# Patient Record
Sex: Male | Born: 1990
Health system: Southern US, Community
[De-identification: ages and names within clinical notes are randomized; demographics above are authoritative.]

---
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
## 2016-07-13 ENCOUNTER — Telehealth: Payer: Self-pay | Admitting: Physician Assistant

## 2016-07-14 NOTE — Telephone Encounter (Signed)
Patient uses [facility] in [place]. Patient states that he will need to call back to make appointment to see when he can get off to come in to be seen.  Advised patient that he would need to be seen. Patient verbalizes understanding.

## 2016-07-14 NOTE — Telephone Encounter (Signed)
Noted, will prescribe when I see him.

## 2016-07-14 NOTE — Telephone Encounter (Signed)
I need the pharmacy to send the request for documentation sake.  And he needs an appointment before I will do any extended script.  I have not seen him in the past 90 days I know for sure.

## 2016-07-14 NOTE — Telephone Encounter (Signed)
lmtcb

## 2016-07-19 ENCOUNTER — Ambulatory Visit: Payer: Self-pay | Admitting: Physician Assistant

## 2017-09-14 ENCOUNTER — Ambulatory Visit (INDEPENDENT_AMBULATORY_CARE_PROVIDER_SITE_OTHER): Admitting: Physician Assistant

## 2017-09-14 ENCOUNTER — Encounter: Payer: Self-pay | Admitting: Physician Assistant

## 2017-09-14 VITALS — BP 134/82 | HR 80 | Temp 99.0°F | Ht 74.0 in | Wt 198.0 lb

## 2017-09-14 DIAGNOSIS — M109 Gout, unspecified: Secondary | ICD-10-CM

## 2017-09-14 MED ORDER — ALLOPURINOL 100 MG PO TABS: 100.0000 mg | ORAL_TABLET | Freq: Three times a day (TID) | ORAL | 8 refills | Status: DC

## 2017-09-14 NOTE — Patient Instructions (Signed)

## 2017-09-14 NOTE — Progress Notes (Signed)
BP 134/82   Pulse 80   Temp 99 F (37.2 C) (Oral)   Ht 6\' 2"  (1.88 m)   Wt 198 lb (89.8 kg)   BMI 25.42 kg/m    Subjective:    Patient ID: Rick Stevens, male    DOB: January 24, 1991, 26 y.o.   MRN: [id number]  HPI: Rick MeyerKyle W Bath is a 26 y.o. male presenting on 09/14/2017 for Follow-up (Patient went to urgent care 10/28 and they told him he had gout in right ankle )  This patient comes in to be established.  He was a patient of mine years ago.  He does not usually get to the doctor very much.  About 10 days ago the patient woke up having swelling and significant pain on the right lateral foot.  He stated that was hot to the touch.  It hurt to even walk on it.  It hurt for anything to touch it.  He denies any fever or chills.  He denies ever having gout before.  He is not aware of any family history of gout.  Relevant past medical, surgical, family and social history reviewed and updated as indicated. Allergies and medications reviewed and updated.  History reviewed. No pertinent past medical history.  History reviewed. No pertinent surgical history.  Review of Systems  Constitutional: Negative.  Negative for appetite change and fatigue.  Eyes: Negative for pain and visual disturbance.  Respiratory: Negative.  Negative for cough, chest tightness, shortness of breath and wheezing.   Cardiovascular: Negative.  Negative for chest pain, palpitations and leg swelling.  Gastrointestinal: Negative.  Negative for abdominal pain, diarrhea, nausea and vomiting.  Genitourinary: Negative.   Musculoskeletal: Positive for arthralgias, gait problem and joint swelling.  Skin: Negative.  Negative for color change and rash.  Neurological: Negative for weakness, numbness and headaches.  Psychiatric/Behavioral: Negative.     Allergies as of 09/14/2017   No Known Allergies     Medication List        Accurate as of 09/14/17  3:44 PM. Always use your most recent med list.          allopurinol 100 MG  tablet Commonly known as:  ZYLOPRIM Take 1 tablet (100 mg total) 3 (three) times daily by mouth.   indomethacin 50 MG capsule Commonly known as:  INDOCIN Take 50 mg by mouth.          Objective:    BP 134/82   Pulse 80   Temp 99 F (37.2 C) (Oral)   Ht 6\' 2"  (1.88 m)   Wt 198 lb (89.8 kg)   BMI 25.42 kg/m   No Known Allergies  Physical Exam  Constitutional: He appears well-developed and well-nourished. No distress.  HENT:  Head: Normocephalic and atraumatic.  Eyes: Conjunctivae and EOM are normal. Pupils are equal, round, and reactive to light.  Cardiovascular: Normal rate, regular rhythm and normal heart sounds.  Pulmonary/Chest: Effort normal and breath sounds normal. No respiratory distress.  Musculoskeletal:       Right ankle: Tenderness. Lateral malleolus tenderness found.       Feet:  Skin: Skin is warm and dry. No rash noted. No erythema.  Psychiatric: He has a normal mood and affect. His behavior is normal.  Nursing note and vitals reviewed.   No results found for this or any previous visit.    Assessment & Plan:   1. Acute gout of right ankle, unspecified cause - indomethacin (INDOCIN) 50 MG capsule; Take 50 mg  by mouth. - allopurinol (ZYLOPRIM) 100 MG tablet; Take 1 tablet (100 mg total) 3 (three) times daily by mouth.  Dispense: 30 tablet; Refill: 8    Current Outpatient Medications:  .  allopurinol (ZYLOPRIM) 100 MG tablet, Take 1 tablet (100 mg total) 3 (three) times daily by mouth., Disp: 30 tablet, Rfl: 8 .  indomethacin (INDOCIN) 50 MG capsule, Take 50 mg by mouth., Disp: , Rfl:  Continue all other maintenance medications as listed above.  Follow up plan: Follow-up as needed or worsening of symptoms. Call office for any issues.   Educational handout given for gout prevention  Remus LofflerAngel S. Jeryn Cerney PA-C [facility] [address]  [place], [place] [address] [phone]   09/14/2017, 3:44 PM

## 2020-12-22 DIAGNOSIS — R599 Enlarged lymph nodes, unspecified: Secondary | ICD-10-CM | POA: Diagnosis not present

## 2020-12-22 DIAGNOSIS — J0100 Acute maxillary sinusitis, unspecified: Secondary | ICD-10-CM | POA: Diagnosis not present

## 2020-12-22 DIAGNOSIS — Z6825 Body mass index (BMI) 25.0-25.9, adult: Secondary | ICD-10-CM | POA: Diagnosis not present

## 2020-12-22 DIAGNOSIS — F419 Anxiety disorder, unspecified: Secondary | ICD-10-CM | POA: Diagnosis not present

## 2021-02-09 DIAGNOSIS — J351 Hypertrophy of tonsils: Secondary | ICD-10-CM | POA: Diagnosis not present

## 2021-02-09 DIAGNOSIS — J342 Deviated nasal septum: Secondary | ICD-10-CM | POA: Diagnosis not present

## 2021-04-21 ENCOUNTER — Ambulatory Visit (INDEPENDENT_AMBULATORY_CARE_PROVIDER_SITE_OTHER): Payer: BC Managed Care – PPO | Admitting: Nurse Practitioner

## 2021-04-21 ENCOUNTER — Encounter: Payer: Self-pay | Admitting: Nurse Practitioner

## 2021-04-21 ENCOUNTER — Other Ambulatory Visit: Payer: Self-pay

## 2021-04-21 ENCOUNTER — Ambulatory Visit (INDEPENDENT_AMBULATORY_CARE_PROVIDER_SITE_OTHER): Payer: BC Managed Care – PPO

## 2021-04-21 VITALS — BP 147/103 | HR 113 | Ht 74.0 in | Wt 204.0 lb

## 2021-04-21 DIAGNOSIS — R1011 Right upper quadrant pain: Secondary | ICD-10-CM | POA: Diagnosis not present

## 2021-04-21 DIAGNOSIS — R109 Unspecified abdominal pain: Secondary | ICD-10-CM | POA: Diagnosis not present

## 2021-04-21 NOTE — Assessment & Plan Note (Signed)
New right upper quadrant pain in the last 7 days.  No nausea, fever, chills.  Patient reports pain is mild and intermittent.  Completed abdominal x-ray to rule out gallbladder abnormalities- r we will esults pending.  Encourage patient to use Tylenol/ibuprofen for pain.  Education provided to patient with printed handouts.  Increase hydration.  And follow-up with worsening unresolved symptoms.

## 2021-04-21 NOTE — Progress Notes (Signed)
Acute Office Visit  Subjective:    Patient ID: Rick Stevens, male    DOB: [birth year], 30 y.o.   MRN: [id number]  Chief Complaint  Patient presents with   Abdominal Pain    RUQ. Comes and goes.     Abdominal Pain This is a new problem. The current episode started in the past 7 days. The onset quality is gradual. The problem occurs intermittently. The problem has been unchanged. The pain is located in the RUQ. The pain is at a severity of 3/10. The pain is mild. The quality of the pain is aching. The abdominal pain does not radiate. Pertinent negatives include no constipation, fever, flatus, headaches, nausea or vomiting. Nothing aggravates the pain. He has tried nothing for the symptoms.     Social History   Socioeconomic History   Marital status: Single    Spouse name: Not on file   Number of children: Not on file   Years of education: Not on file   Highest education level: Not on file  Occupational History   Not on file  Tobacco Use   Smoking status: Every Day    Pack years: 0.00   Smokeless tobacco: Never  Vaping Use   Vaping Use: Never used  Substance and Sexual Activity   Alcohol use: Yes    Comment: occ   Drug use: No   Sexual activity: Not on file  Other Topics Concern   Not on file  Social History Narrative   Not on file   Social Determinants of Health   Financial Resource Strain: Not on file  Food Insecurity: Not on file  Transportation Needs: Not on file  Physical Activity: Not on file  Stress: Not on file  Social Connections: Not on file  Intimate Partner Violence: Not on file    Outpatient Medications Prior to Visit  Medication Sig Dispense Refill   allopurinol (ZYLOPRIM) 100 MG tablet Take 1 tablet (100 mg total) 3 (three) times daily by mouth. 30 tablet 8   No facility-administered medications prior to visit.    No Known Allergies  Review of Systems  Constitutional: Negative.  Negative for fever.  Cardiovascular: Negative.    Gastrointestinal:  Positive for abdominal pain. Negative for constipation, flatus, nausea and vomiting.  Genitourinary: Negative.   Skin:  Negative for rash.  Neurological:  Negative for headaches.  All other systems reviewed and are negative.     Objective:    Physical Exam Vitals and nursing note reviewed.  Constitutional:      Appearance: He is well-developed and normal weight.  HENT:     Head: Normocephalic.     Mouth/Throat:     Mouth: Mucous membranes are moist.  Cardiovascular:     Rate and Rhythm: Normal rate and regular rhythm.  Pulmonary:     Effort: Pulmonary effort is normal.     Breath sounds: Normal breath sounds.  Abdominal:     General: Abdomen is flat.     Palpations: Abdomen is soft.     Tenderness: There is abdominal tenderness in the right upper quadrant. There is no right CVA tenderness or left CVA tenderness.  Skin:    Findings: No rash.  Neurological:     Mental Status: He is alert.  Psychiatric:        Behavior: Behavior normal.    BP (!) 147/103   Pulse (!) 113   Ht 6\' 2"  (1.88 m)   Wt 204 lb (92.5 kg)   SpO2  100%   BMI 26.19 kg/m  Wt Readings from Last 3 Encounters:  04/21/21 204 lb (92.5 kg)  09/14/17 198 lb (89.8 kg)    There are no preventive care reminders to display for this patient.  There are no preventive care reminders to display for this patient.       Assessment & Plan:   Problem List Items Addressed This Visit       Other   Right upper quadrant abdominal pain - Primary    New right upper quadrant pain in the last 7 days.  No nausea, fever, chills.  Patient reports pain is mild and intermittent.  Completed abdominal x-ray to rule out gallbladder abnormalities- r we will esults pending.  Encourage patient to use Tylenol/ibuprofen for pain.  Education provided to patient with printed handouts.  Increase hydration.  And follow-up with worsening unresolved symptoms.       Relevant Orders   DG Abd 2 Views     No  orders of the defined types were placed in this encounter.    Daryll Drown, NP

## 2021-04-21 NOTE — Patient Instructions (Signed)
Abdominal Pain, Adult Many things can cause belly (abdominal) pain. Most times, belly pain is not dangerous. Many cases of belly pain can be watched and treated at home. Sometimes, though, belly pain is serious. Yourdoctor will try to find the cause of your belly pain. Follow these instructions at home:  Medicines Take over-the-counter and prescription medicines only as told by your doctor. Do not take medicines that help you poop (laxatives) unless told by your doctor. General instructions Watch your belly pain for any changes. Drink enough fluid to keep your pee (urine) pale yellow. Keep all follow-up visits as told by your doctor. This is important. Contact a doctor if: Your belly pain changes or gets worse. You are not hungry, or you lose weight without trying. You are having trouble pooping (constipated) or have watery poop (diarrhea) for more than 2-3 days. You have pain when you pee or poop. Your belly pain wakes you up at night. Your pain gets worse with meals, after eating, or with certain foods. You are vomiting and cannot keep anything down. You have a fever. You have blood in your pee. Get help right away if: Your pain does not go away as soon as your doctor says it should. You cannot stop vomiting. Your pain is only in areas of your belly, such as the right side or the left lower part of the belly. You have bloody or black poop, or poop that looks like tar. You have very bad pain, cramping, or bloating in your belly. You have signs of not having enough fluid or water in your body (dehydration), such as: Dark pee, very little pee, or no pee. Cracked lips. Dry mouth. Sunken eyes. Sleepiness. Weakness. You have trouble breathing or chest pain. Summary Many cases of belly pain can be watched and treated at home. Watch your belly pain for any changes. Take over-the-counter and prescription medicines only as told by your doctor. Contact a doctor if your belly pain  changes or gets worse. Get help right away if you have very bad pain, cramping, or bloating in your belly. This information is not intended to replace advice given to you by your health care provider. Make sure you discuss any questions you have with your healthcare provider. Document Revised: 03/05/2019 Document Reviewed: 03/05/2019 Elsevier Patient Education  2022 Elsevier Inc.  

## 2021-05-04 ENCOUNTER — Encounter: Payer: Self-pay | Admitting: Nurse Practitioner

## 2021-05-04 ENCOUNTER — Ambulatory Visit (INDEPENDENT_AMBULATORY_CARE_PROVIDER_SITE_OTHER): Payer: BC Managed Care – PPO | Admitting: Nurse Practitioner

## 2021-05-04 ENCOUNTER — Other Ambulatory Visit: Payer: Self-pay

## 2021-05-04 VITALS — BP 131/89 | HR 110 | Temp 97.6°F | Ht 74.0 in | Wt 204.0 lb

## 2021-05-04 DIAGNOSIS — R1011 Right upper quadrant pain: Secondary | ICD-10-CM

## 2021-05-04 DIAGNOSIS — R110 Nausea: Secondary | ICD-10-CM

## 2021-05-04 MED ORDER — ONDANSETRON HCL 4 MG PO TABS: 4.0000 mg | ORAL_TABLET | Freq: Three times a day (TID) | ORAL | 0 refills | Status: DC | PRN

## 2021-05-04 NOTE — Progress Notes (Signed)
Acute Office Visit  Subjective:    Patient ID: Rick Stevens, male    DOB: 05-28-1991, 30 y.o.   MRN: [id number]  Chief Complaint  Patient presents with   Abdominal Pain     Patient  Abdominal Pain This is a recurrent problem. The current episode started 1 to 4 weeks ago. The onset quality is gradual. The problem occurs intermittently. The problem has been unchanged. The pain is located in the RUQ. The pain is moderate. The quality of the pain is aching. The abdominal pain does not radiate. Associated symptoms include nausea. The pain is aggravated by eating. The pain is relieved by Nothing. He has tried acetaminophen for the symptoms. The treatment provided no relief.      Social History   Socioeconomic History   Marital status: Single    Spouse name: Not on file   Number of children: Not on file   Years of education: Not on file   Highest education level: Not on file  Occupational History   Not on file  Tobacco Use   Smoking status: Every Day    Pack years: 0.00   Smokeless tobacco: Never  Vaping Use   Vaping Use: Never used  Substance and Sexual Activity   Alcohol use: Yes    Comment: occ   Drug use: No   Sexual activity: Not on file  Other Topics Concern   Not on file  Social History Narrative   Not on file   Social Determinants of Health   Financial Resource Strain: Not on file  Food Insecurity: Not on file  Transportation Needs: Not on file  Physical Activity: Not on file  Stress: Not on file  Social Connections: Not on file  Intimate Partner Violence: Not on file    No outpatient medications prior to visit.   No facility-administered medications prior to visit.    Review of Systems  Constitutional: Negative.   HENT: Negative.    Respiratory: Negative.    Gastrointestinal:  Positive for abdominal pain and nausea.  Genitourinary: Negative.   Skin: Negative.   All other systems reviewed and are negative.     Objective:    Physical  Exam Vitals and nursing note reviewed.  Constitutional:      Appearance: He is well-developed.  HENT:     Head: Normocephalic.     Nose: Nose normal.  Eyes:     Conjunctiva/sclera: Conjunctivae normal.  Cardiovascular:     Rate and Rhythm: Normal rate and regular rhythm.  Pulmonary:     Effort: Pulmonary effort is normal.     Breath sounds: Normal breath sounds.  Abdominal:     General: Abdomen is flat. Bowel sounds are normal.     Palpations: Abdomen is soft.     Tenderness: There is abdominal tenderness in the right upper quadrant. There is no right CVA tenderness, left CVA tenderness, guarding or rebound. Negative signs include McBurney's sign.  Skin:    General: Skin is warm.     Findings: No rash.  Neurological:     Mental Status: He is alert and oriented to person, place, and time.  Psychiatric:        Behavior: Behavior normal.    BP 131/89   Pulse (!) 110   Temp 97.6 F (36.4 C) (Temporal)   Ht 6' 2"  (1.88 m)   Wt 204 lb (92.5 kg)   SpO2 98%   BMI 26.19 kg/m  Wt Readings from Last 3 Encounters:  05/04/21 204 lb (92.5 kg)  04/21/21 204 lb (92.5 kg)  09/14/17 198 lb (89.8 kg)    There are no preventive care reminders to display for this patient.  There are no preventive care reminders to display for this patient.   No results found for: TSH No results found for: WBC, HGB, HCT, MCV, PLT No results found for: NA, K, CHLORIDE, CO2, GLUCOSE, BUN, CREATININE, BILITOT, ALKPHOS, AST, ALT, PROT, ALBUMIN, CALCIUM, ANIONGAP, EGFR, GFR No results found for: CHOL No results found for: HDL No results found for: LDLCALC No results found for: TRIG No results found for: CHOLHDL No results found for: HGBA1C     Assessment & Plan:   Problem List Items Addressed This Visit       Other   Right upper quadrant abdominal pain - Primary    Right upper quadrant abdominal pain not well controlled.  Symptoms lasting up to 2 weeks.  Right upper quadrant abdominal chest  x-ray negative for ileus, bowel obstruction, or any abnormalities.  Patient continues to report on changing pain worsened when laying down and worse with eating food from [facility].  Intermittent nausea in the last week.  Patient reports he has not had any fried foods or sodas in the last week. I completed referral to GI, continue symptom management as discussed close in clinic. Zofran for nausea Rx sent to pharmacy.  Follow-up with worsening unresolved symptoms.  Please keep GI appointment.   Printed handouts given.         Relevant Medications   ondansetron (ZOFRAN) 4 MG tablet   Other Relevant Orders   Ambulatory referral to Gastroenterology   Nausea    Zofran 4 mg by mouth as needed. Follow-up with worsening unresolved symptoms.       Relevant Medications   ondansetron (ZOFRAN) 4 MG tablet     Meds ordered this encounter  Medications   ondansetron (ZOFRAN) 4 MG tablet    Sig: Take 1 tablet (4 mg total) by mouth every 8 (eight) hours as needed for nausea or vomiting.    Dispense:  20 tablet    Refill:  0    Order Specific Question:   Supervising Provider    Answer:   Janora Norlander [[id number]]     Ivy Lynn, NP

## 2021-05-04 NOTE — Assessment & Plan Note (Signed)
Right upper quadrant abdominal pain not well controlled.  Symptoms lasting up to 2 weeks.  Right upper quadrant abdominal chest x-ray negative for ileus, bowel obstruction, or any abnormalities.  Patient continues to report on changing pain worsened when laying down and worse with eating food from [facility].  Intermittent nausea in the last week.  Patient reports he has not had any fried foods or sodas in the last week. I completed referral to GI, continue symptom management as discussed close in clinic. Zofran for nausea Rx sent to pharmacy.  Follow-up with worsening unresolved symptoms.  Please keep GI appointment.   Printed handouts given.

## 2021-05-04 NOTE — Assessment & Plan Note (Signed)
Zofran 4 mg by mouth as needed. Follow-up with worsening unresolved symptoms.

## 2021-05-04 NOTE — Patient Instructions (Signed)
Unresolved abdominal pain in the last 1 to 2 weeks.  Abdominal right upper quadrant x-ray normal.  Will refer patient to GI for further assessment, continue symptom management of pain with ibuprofen/Tylenol as tolerated. Abdominal Pain, Adult Many things can cause belly (abdominal) pain. Most times, belly pain is not dangerous. Many cases of belly pain can be watched and treated at home. Sometimes, though, belly pain is serious. Yourdoctor will try to find the cause of your belly pain. Follow these instructions at home:  Medicines Take over-the-counter and prescription medicines only as told by your doctor. Do not take medicines that help you poop (laxatives) unless told by your doctor. General instructions Watch your belly pain for any changes. Drink enough fluid to keep your pee (urine) pale yellow. Keep all follow-up visits as told by your doctor. This is important. Contact a doctor if: Your belly pain changes or gets worse. You are not hungry, or you lose weight without trying. You are having trouble pooping (constipated) or have watery poop (diarrhea) for more than 2-3 days. You have pain when you pee or poop. Your belly pain wakes you up at night. Your pain gets worse with meals, after eating, or with certain foods. You are vomiting and cannot keep anything down. You have a fever. You have blood in your pee. Get help right away if: Your pain does not go away as soon as your doctor says it should. You cannot stop vomiting. Your pain is only in areas of your belly, such as the right side or the left lower part of the belly. You have bloody or black poop, or poop that looks like tar. You have very bad pain, cramping, or bloating in your belly. You have signs of not having enough fluid or water in your body (dehydration), such as: Dark pee, very little pee, or no pee. Cracked lips. Dry mouth. Sunken eyes. Sleepiness. Weakness. You have trouble breathing or chest  pain. Summary Many cases of belly pain can be watched and treated at home. Watch your belly pain for any changes. Take over-the-counter and prescription medicines only as told by your doctor. Contact a doctor if your belly pain changes or gets worse. Get help right away if you have very bad pain, cramping, or bloating in your belly. This information is not intended to replace advice given to you by your health care provider. Make sure you discuss any questions you have with your healthcare provider. Document Revised: 03/05/2019 Document Reviewed: 03/05/2019 Elsevier Patient Education  2022 [employer].

## 2021-05-12 ENCOUNTER — Encounter: Payer: Self-pay | Admitting: Gastroenterology

## 2021-06-10 ENCOUNTER — Ambulatory Visit: Payer: Self-pay | Admitting: Gastroenterology

## 2021-07-22 ENCOUNTER — Encounter: Payer: Self-pay | Admitting: Gastroenterology

## 2021-07-22 ENCOUNTER — Other Ambulatory Visit: Payer: BC Managed Care – PPO

## 2021-07-22 ENCOUNTER — Ambulatory Visit (INDEPENDENT_AMBULATORY_CARE_PROVIDER_SITE_OTHER): Payer: BC Managed Care – PPO | Admitting: Gastroenterology

## 2021-07-22 VITALS — BP 138/86 | HR 128 | Ht 74.0 in | Wt 205.0 lb

## 2021-07-22 DIAGNOSIS — R1011 Right upper quadrant pain: Secondary | ICD-10-CM | POA: Diagnosis not present

## 2021-07-22 NOTE — Patient Instructions (Signed)
If you are age 32 or older, your body mass index should be between 23-30. Your Body mass index is 26.32 kg/m. If this is out of the aforementioned range listed, please consider follow up with your Primary Care Provider.  If you are age 56 or younger, your body mass index should be between 19-25. Your Body mass index is 26.32 kg/m. If this is out of the aformentioned range listed, please consider follow up with your Primary Care Provider.   You have been scheduled for an abdominal ultrasound at [facility] (1st floor of hospital) on 07/28/21 at 9:30am. Please arrive 15 minutes prior to your appointment for registration. Make certain not to have anything to eat or drink 6 hours prior to your appointment. Should you need to reschedule your appointment, please contact radiology at [phone]. This test typically takes about 30 minutes to perform.  Your provider has requested that you go to the basement level for lab work before leaving today. Press "B" on the elevator. The lab is located at the first door on the left as you exit the elevator.   The [facility] providers would like to encourage you to use [facility] to communicate with providers for non-urgent requests or questions.  Due to long hold times on the telephone, sending your provider a message by [facility] may be a faster and more efficient way to get a response.  Please allow 48 business hours for a response.  Please remember that this is for non-urgent requests.   It was a pleasure to see you today!  Thank you for trusting me with your gastrointestinal care!

## 2021-07-22 NOTE — Progress Notes (Signed)
HPI : Rick Stevens is a very pleasant 30 year old male with no chronic medical history referred to [place] by Lynnell Chad, FNP for persistent right upper quadrant pain.  Patient states the pain started in June.  It was first noticed after he had played a lot of golf.  The pain comes and goes usually lasts for several days.  It is noticed more when he stretches or when he plays lots of golf.  The pain is noticed more with certain positions and activities.  Sometimes the pain can be up to 5 out of 10 in severity.  He denies symptoms of nausea or vomiting associated with the pain.  Sometimes he thinks the pain may be related to meals, other times not.  The pain does not bother him at night. He will sometimes feel "puffy headed" when he has the pain and will have some reddening of his eyelids which clears up with eyedrops, but otherwise no associated symptoms with pain.  No recent changes to his diet.  No medications.  He denies any GERD symptoms.  He typically has regular bowel movements that are poorly formed most the time. No unintentional weight loss.      History reviewed. No pertinent past medical history.   History reviewed. No pertinent surgical history. Family History  Problem Relation Age of Onset   Colon cancer Neg Hx    Rectal cancer Neg Hx    Esophageal cancer Neg Hx    Social History   Tobacco Use   Smoking status: Every Day   Smokeless tobacco: Never  Vaping Use   Vaping Use: Never used  Substance Use Topics   Alcohol use: Yes    Comment: occ   Drug use: No   No current outpatient medications on file.   No current facility-administered medications for this visit.   No Known Allergies   Review of Systems: All systems reviewed and negative except where noted in HPI.    No results found.  Physical Exam: BP 138/86   Pulse (!) 128   Ht 6\' 2"  (1.88 m)   Wt 205 lb (93 kg)   SpO2 98%   BMI 26.32 kg/m  Constitutional: Pleasant,well-developed, Caucasian male in no  acute distress. HEENT: Normocephalic and atraumatic. Conjunctivae are normal. No scleral icterus. Neck supple.  Cardiovascular: Tachycardic, regular rhythm, no murmurs Pulmonary/chest: Effort normal and breath sounds normal. No wheezing, rales or rhonchi. Abdominal: Soft, nondistended, nontender. Bowel sounds active throughout. There are no masses palpable. No hepatomegaly.  Tenderness appreciated over the rib cage or costal margin Extremities: no edema Neurological: Alert and oriented to person place and time. Skin: Skin is warm and dry. No rashes noted. Psychiatric: Normal mood and affect. Behavior is normal.  CBC No results found for: WBC, RBC, HGB, HCT, PLT, MCV, MCH, MCHC, RDW, LYMPHSABS, MONOABS, EOSABS, BASOSABS  CMP  No results found for: NA, K, CL, CO2, GLUCOSE, BUN, CREATININE, CALCIUM, PROT, ALBUMIN, AST, ALT, ALKPHOS, BILITOT, GFRNONAA, GFRAA   ASSESSMENT AND PLAN: 30 year old male with right upper quadrant pain for the past few months which is aggravated by physical activity and positions, without a meal component to it.  This history seems most consistent with a musculoskeletal source such as a muscle strain or costochondritis.  Pain not able to be reproduced on examination today.  Low suspicion for a GI source, but will evaluate for cholelithiasis and H. pylori with a right upper quadrant ultrasound and a stool antigen test.  I recommended the  patient follow-up with his PCM for further management of suspected musculoskeletal pain, with NSAIDs, acetaminophen or heat application.  Right upper quadrant pain, suspect MSK source - Right upper quadrant ultrasound - H. pylori stool antigen  Tachycardia/elevated blood pressure -Patient states he gets anxious in the doctor's office, recommended he periodically take his pulse at home to ensure that he is not consistently tachycardic - Consider home blood pressure cuff monitoring and follow-up with PCM regarding elevated blood  pressure  Rick Hazen E. Tomasa Rand, MD [facility]    Daryll Drown, NP

## 2021-07-23 ENCOUNTER — Other Ambulatory Visit: Payer: BC Managed Care – PPO

## 2021-07-23 DIAGNOSIS — R1011 Right upper quadrant pain: Secondary | ICD-10-CM

## 2021-07-25 LAB — H. PYLORI ANTIGEN, STOOL: H pylori Ag, Stl: NEGATIVE

## 2021-07-28 ENCOUNTER — Ambulatory Visit (HOSPITAL_COMMUNITY)
Admission: RE | Admit: 2021-07-28 | Discharge: 2021-07-28 | Disposition: A | Discharge status: Home / Self Care | Payer: BC Managed Care – PPO | Source: Ambulatory Visit | Attending: Gastroenterology | Admitting: Gastroenterology

## 2021-07-28 ENCOUNTER — Other Ambulatory Visit: Payer: Self-pay

## 2021-07-28 DIAGNOSIS — R1011 Right upper quadrant pain: Secondary | ICD-10-CM | POA: Diagnosis not present

## 2021-07-30 NOTE — Progress Notes (Signed)
Darrie, your H. Pylori stool antigen test is negative.  As discussed, if your is not improving, I would recommend getting the ultrasound to rule out gallstones.

## 2022-01-22 IMAGING — US US ABDOMEN LIMITED
1 series · 15 of 25 positions shown · non-contrast
Comparison: None.

CLINICAL DATA: Right upper quadrant abdominal pain.

EXAM:
ULTRASOUND ABDOMEN LIMITED RIGHT UPPER QUADRANT

[Series 1: us abdomen limited ruq mc & wl · 15 of 49 slices shown]
[im 1/49]
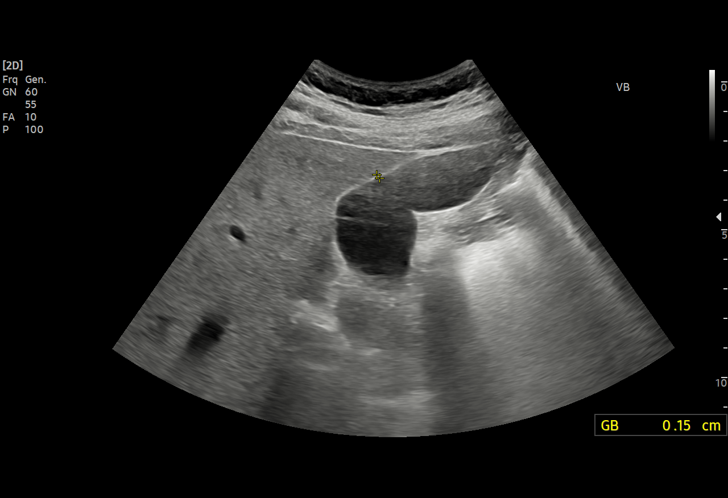
[im 5/49]
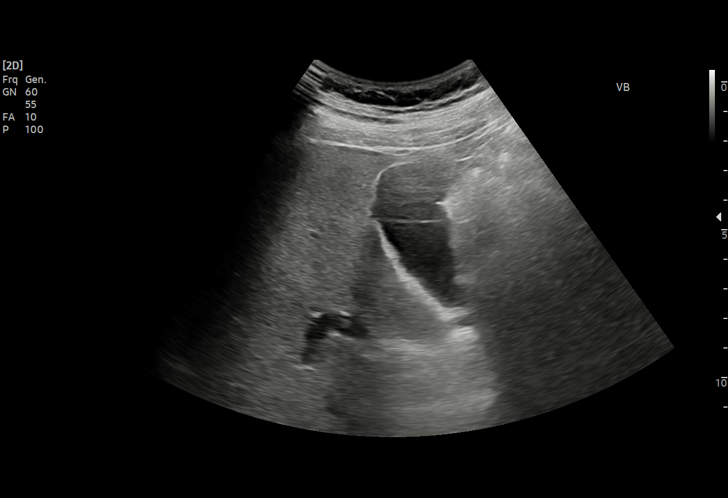
[im 9/49]
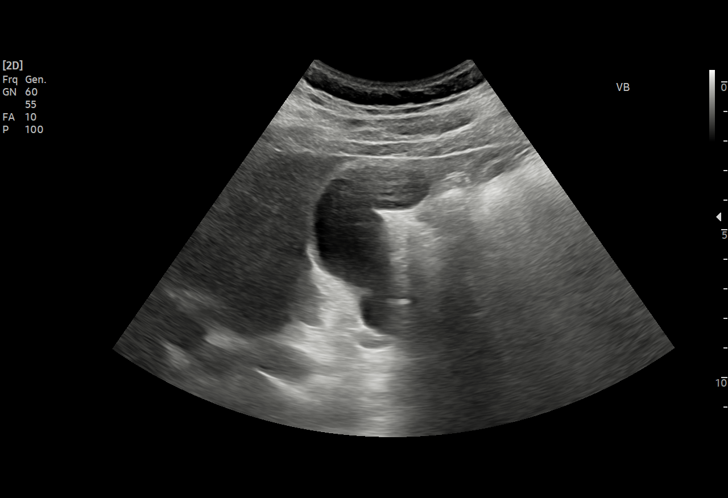
[im 11/49]
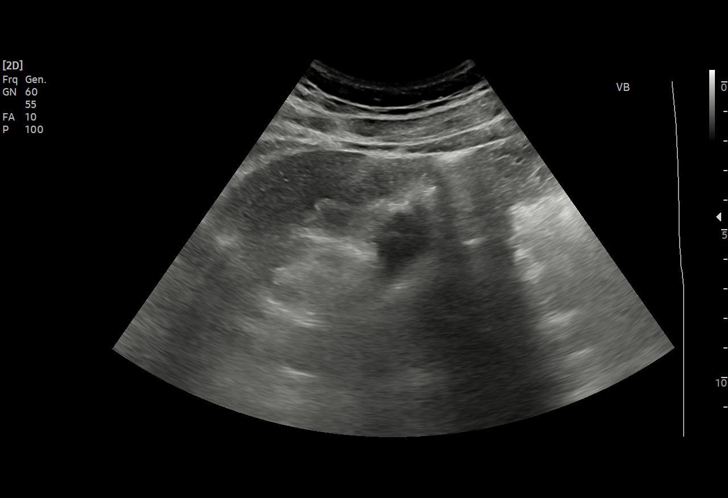
[im 15/49]
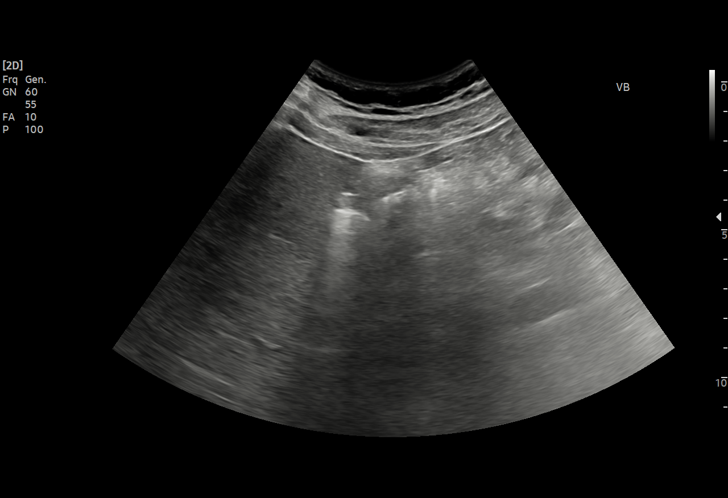
[im 19/49]
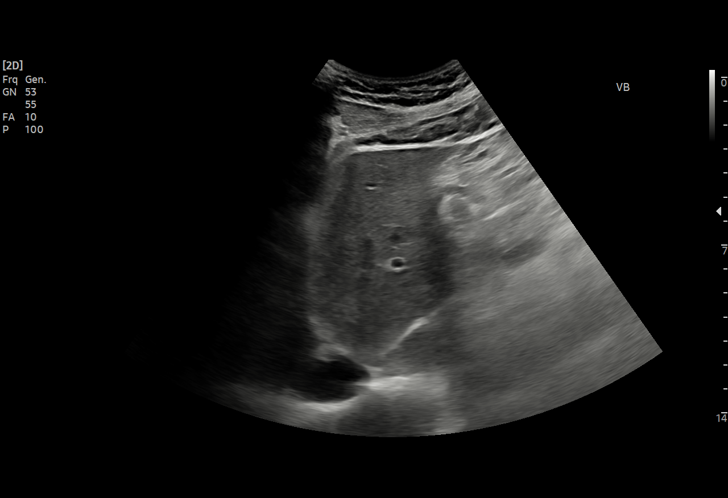
[im 21/49]
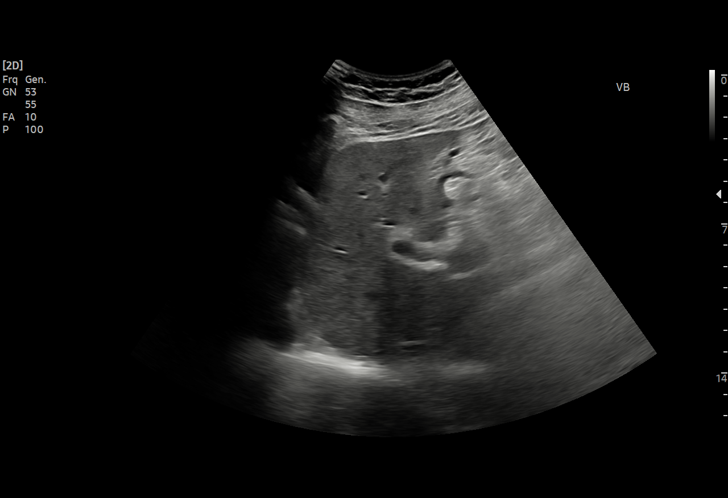
[im 25/49]
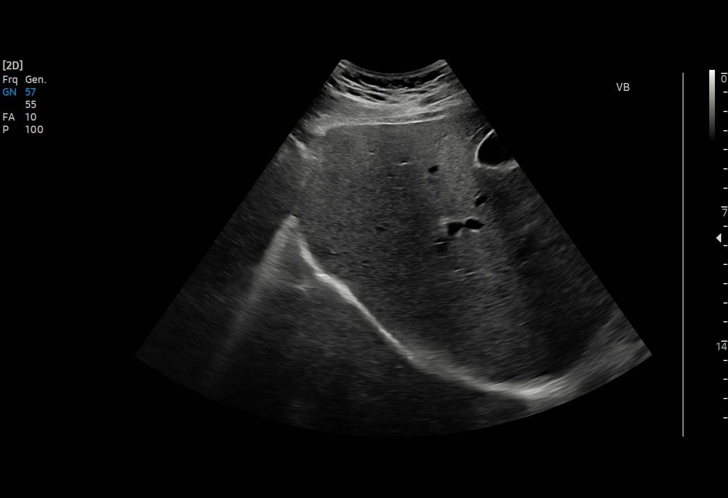
[im 29/49]
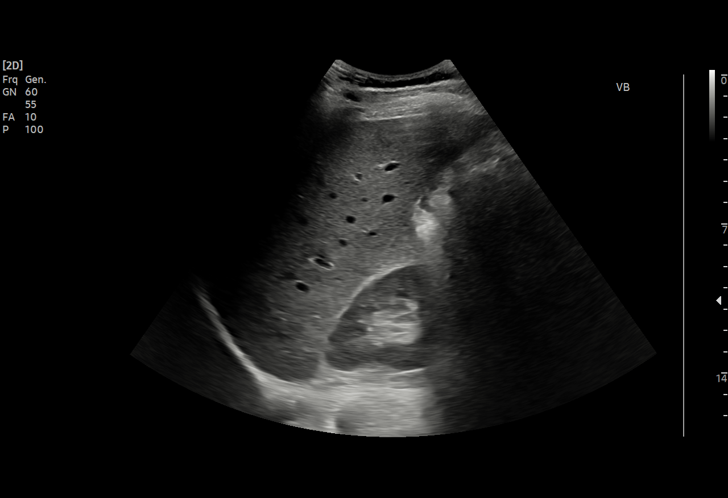
[im 31/49]
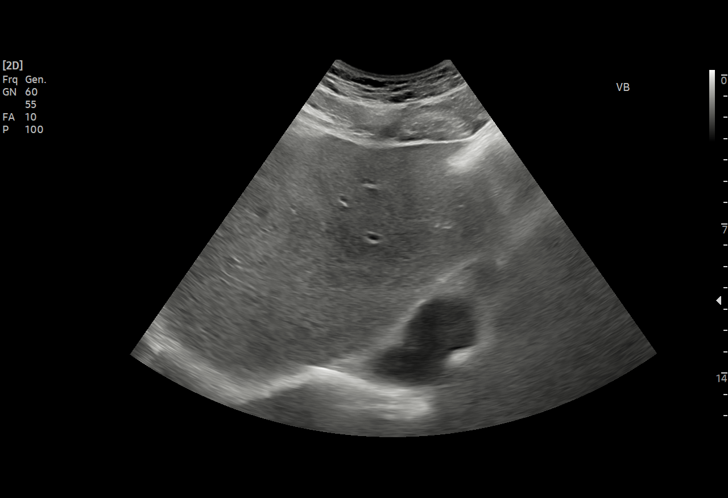
[im 35/49]
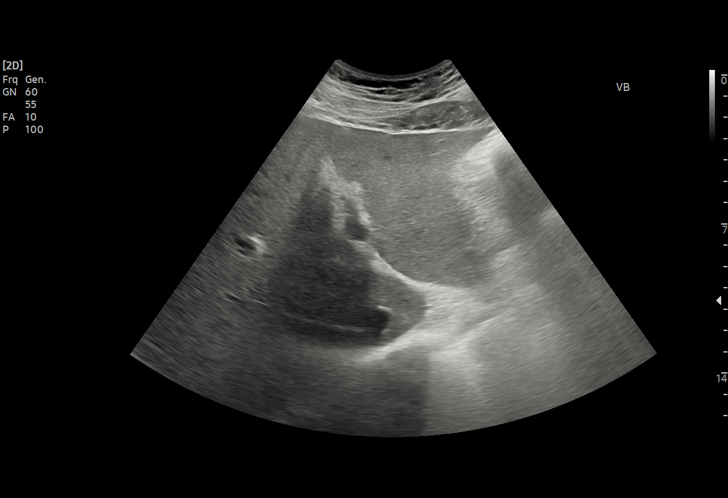
[im 39/49]
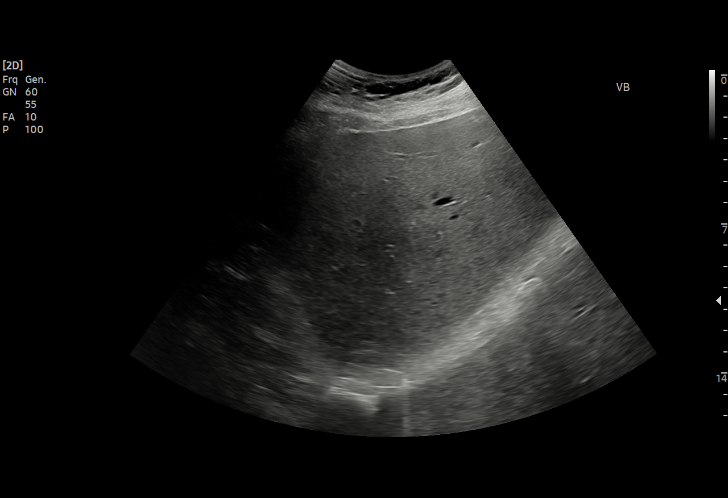
[im 41/49]
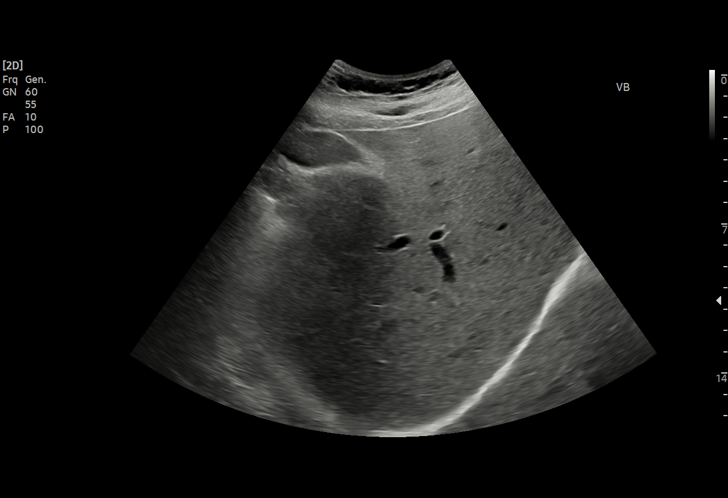
[im 45/49]
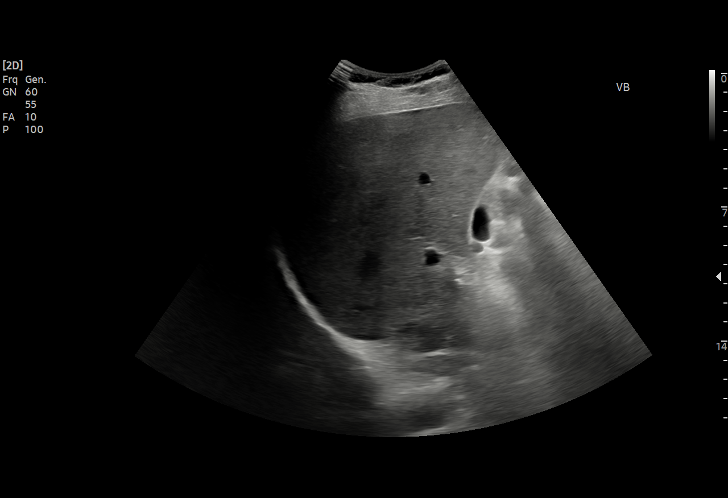
[im 49/49]
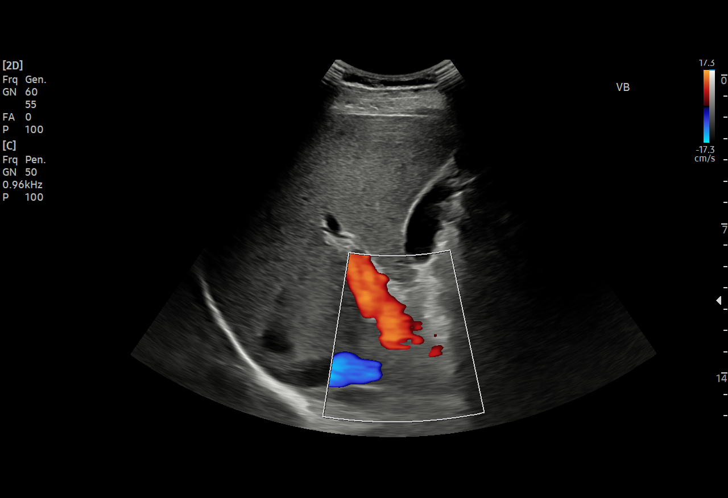

[15 of 25 positions shown; findings below may reference images not displayed]

FINDINGS: Gallbladder:

No gallstones or wall thickening visualized. No sonographic Murphy
sign noted by sonographer.

Common bile duct:

Diameter: 3.1 mm.

Liver:

No focal lesion identified. Within normal limits in parenchymal
echogenicity. Portal vein is patent on color Doppler imaging with
normal direction of blood flow towards the liver.

Other: None.
IMPRESSION: Normal.

## 2022-02-01 DIAGNOSIS — M549 Dorsalgia, unspecified: Secondary | ICD-10-CM | POA: Diagnosis not present

## 2022-02-01 DIAGNOSIS — Z6826 Body mass index (BMI) 26.0-26.9, adult: Secondary | ICD-10-CM | POA: Diagnosis not present

## 2022-02-01 DIAGNOSIS — R0789 Other chest pain: Secondary | ICD-10-CM | POA: Diagnosis not present

## 2022-02-04 DIAGNOSIS — M549 Dorsalgia, unspecified: Secondary | ICD-10-CM | POA: Diagnosis not present

## 2022-02-04 DIAGNOSIS — R0789 Other chest pain: Secondary | ICD-10-CM | POA: Diagnosis not present

## 2022-02-04 DIAGNOSIS — R0781 Pleurodynia: Secondary | ICD-10-CM | POA: Diagnosis not present

## 2022-02-04 DIAGNOSIS — Y92410 Unspecified street and highway as the place of occurrence of the external cause: Secondary | ICD-10-CM | POA: Diagnosis not present
